# Patient Record
Sex: Male | Born: 2000
Health system: Southern US, Community
[De-identification: ages and names within clinical notes are randomized; demographics above are authoritative.]

## PROBLEM LIST (undated history)

## (undated) DIAGNOSIS — E119 Type 2 diabetes mellitus without complications: Secondary | ICD-10-CM

## (undated) DIAGNOSIS — J45909 Unspecified asthma, uncomplicated: Secondary | ICD-10-CM

## (undated) HISTORY — DX: Unspecified asthma, uncomplicated: J45.909

## (undated) HISTORY — DX: Type 2 diabetes mellitus without complications: E11.9

---
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
## 2022-01-30 ENCOUNTER — Other Ambulatory Visit (INDEPENDENT_AMBULATORY_CARE_PROVIDER_SITE_OTHER): Payer: Self-pay | Admitting: Family

## 2022-01-30 DIAGNOSIS — E1065 Type 1 diabetes mellitus with hyperglycemia: Secondary | ICD-10-CM

## 2022-03-28 ENCOUNTER — Ambulatory Visit (INDEPENDENT_AMBULATORY_CARE_PROVIDER_SITE_OTHER): Payer: No Typology Code available for payment source | Admitting: Family

## 2022-05-01 ENCOUNTER — Ambulatory Visit (INDEPENDENT_AMBULATORY_CARE_PROVIDER_SITE_OTHER): Payer: No Typology Code available for payment source | Admitting: Family

## 2022-05-01 ENCOUNTER — Encounter (INDEPENDENT_AMBULATORY_CARE_PROVIDER_SITE_OTHER): Payer: Self-pay | Admitting: Family

## 2022-05-01 VITALS — BP 118/70 | HR 76 | Wt 191.2 lb

## 2022-05-01 DIAGNOSIS — E1065 Type 1 diabetes mellitus with hyperglycemia: Secondary | ICD-10-CM | POA: Diagnosis not present

## 2022-05-01 DIAGNOSIS — Z4681 Encounter for fitting and adjustment of insulin pump: Secondary | ICD-10-CM | POA: Diagnosis not present

## 2022-05-01 DIAGNOSIS — E109 Type 1 diabetes mellitus without complications: Secondary | ICD-10-CM

## 2022-05-01 LAB — POCT GLUCOSE (DEVICE FOR HOME USE): POC Glucose: 121 mg/dl — AB (ref 70–99)

## 2022-05-01 LAB — POCT GLYCOSYLATED HEMOGLOBIN (HGB A1C): Hemoglobin A1C: 8.6 % — AB (ref 4.0–5.6)

## 2022-05-01 NOTE — Progress Notes (Signed)
Pediatric Endocrinology Diabetes Consultation Follow-up Visit  Stephen Weber [birth year] [id number]  Chief Complaint: Follow-up type 1 diabetes   [name], NP   HPI: Stephen Weber  is a 22 y.o. male presenting for follow-up of type 1 diabetes. he is accompanied to this visit by his father and sister.   1. "Stephen Weber" was seen by his PCP on November 13th 2015 for a sick visit due to family concerns regarding weight loss, increased thirst and frequent urination. He had lost ~15 pounds.  At the PCP's office Stephen Weber was noted to have a BG in the 400s and he was admitted to the pediatric unit at [facility] for further evaluation and management. CBG was 300. Venous pH was 7.342. Serum glucose was 338, CO2 20, and anion gap 18. Urine ketones were 15. HbA1c was 15.8%. Anti-GAD antibody was positive at 5.6 (normal < 1). Anti-insulin antibody was positive at 2.3 (normal < 0.4). Anti-islet cell antibody was negative. TSH was 1.460. Free T4 was 0.98. He was started on an MDI insulin plan with Lantus and Novolog.  He transitioned to an omnipod and dexcom CGM in 05/2015.  2. Since last visit to [facility] on 12/2021, Stephen Weber has been well.  No ER visits or hospitalizations.    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   States that diabetes care has been "pretty good, Not many changes". He is trying to do a cutting diet so he has cut back on carbs. Omnipod 5 and Dexcom CGM are working well. Reports bolusing consistently, usually boluses when he starts eating. Estimates his carb intake at meals is 40-50 grams of carbs per meal. Hypoglycemia is rare, none severe or requiring glucagon.    Insulin regimen: Using Humalog in his pod  12AM-8AM 1.0   8AM-2PM 1.3  2PM-12AM 1.1            Insulin to Carbohydrate Ratio 12AM-6AM 15  6AM-6pm 7  6pm-10pm 8  10pm-12pm 9       Insulin Sensitivity Factor 12AM 40               Target Blood Glucose 12AM-6AM 110  6AM-9PM 120   9PM-12AM 110        Active insulin time 3 hours  Hypoglycemia:  Able to feel lows. He usually feels shaky.  No glucagon needed recently.   Insulin pump and CGM download:    - Pump report shows he is entering 10-35 grams of carbs per meal. Usually having prolonged periods of hyperglycemia after meals.  Med-alert ID: not wearing  Pump/CGM sites: Using abdomen and legs  for pump and legs, and abs for CGM.  Annual labs due: Last exam on 02/2022. Due on 02/2023    3. ROS: Greater than 10 systems reviewed with pertinent positives listed in HPI, otherwise neg. Review of Systems  Constitutional:  Negative for malaise/fatigue.  HENT: Negative.    Eyes:  Negative for blurred vision and pain.  Respiratory:  Negative for cough and shortness of breath.   Cardiovascular:  Negative for chest pain and palpitations.  Gastrointestinal:  Negative for abdominal pain, constipation, diarrhea, nausea and vomiting.  Genitourinary:  Negative for frequency and urgency.  Musculoskeletal:  Negative for neck pain.  Skin:  Negative for itching and rash.  Neurological:  Negative for dizziness, tingling, tremors, sensory change, seizures, weakness and headaches.  Endo/Heme/Allergies:  Negative for polydipsia.  Psychiatric/Behavioral:  Negative for depression. The patient is not nervous/anxious.  Past Medical History:   Past Medical History:  Diagnosis Date   Asthma    Diabetes mellitus without complication ([place])     Medications:  Outpatient Encounter Medications as of 05/01/2022  Medication Sig   Continuous Blood Gluc Sensor (DEXCOM G6 SENSOR) MISC CHANGE SENSOR EVERY 10 DAYS   Continuous Blood Gluc Transmit (DEXCOM G6 TRANSMITTER) MISC USE WITH DEXCOM G6 SENSOR  REUSE FOR 3 MONTHS   insulin aspart (NOVOLOG) 100 UNIT/ML injection INJECT UP TO 200 UNITS IN INSULIN PUMP EVERY 48-72 HOURS PER DKA AND HYPERGLYCEMIA PROTOCOLS (90 DAY SUPPLY)   Insulin Disposable Pump (OMNIPOD 5 G6 POD, GEN 5,) MISC  Inject 1 Device into the skin as directed. Change pod every 2 days. Patient will need 3 boxes (each contain 5 pods) for a 30 day supply. Please fill for [facility] [address]21.   ACCU-CHEK FASTCLIX LANCETS MISC 1 each by Does not apply route as directed. Check sugar 6 x daily (Patient not taking: Reported on 04/05/2021)   acetone, urine, test strip Check ketones per protocol (Patient not taking: Reported on 04/05/2021)   albuterol (PROVENTIL, VENTOLIN) (5 MG/ML) 0.5% NEBU Take by nebulization daily as needed. (Patient not taking: Reported on 12/20/2019)   Blood Glucose Monitoring Suppl (ACCU-CHEK GUIDE) w/Device KIT 1 kit by Does not apply route daily as needed. (Patient not taking: Reported on 04/05/2021)   glucose blood test strip Use to check blood sugars 6 times daily (Patient not taking: Reported on 04/05/2021)   insulin aspart (NOVOLOG) 100 UNIT/ML FlexPen Use up to 50 units daily per provider instructions; to use as back up in case insulin pump fails (Patient not taking: Reported on 05/01/2022)   insulin degludec (TRESIBA FLEXTOUCH) 100 UNIT/ML FlexTouch Pen Inject up to 50 units per day SubQ if pump failure (Patient not taking: Reported on 05/01/2022)   Insulin Pen Needle 31G X 5 MM MISC Use to administer insulin (Patient not taking: Reported on 12/27/2021)   montelukast (SINGULAIR) 10 MG tablet Take 10 mg by mouth at bedtime. (Patient not taking: Reported on 02/19/2021)   No facility-administered encounter medications on file as of 05/01/2022.    Allergies: Allergies  Allergen Reactions   Pollen Extract     Reports skin lumps when using novolog  Surgical History: No recent surgeries  Family History:  Family History  Problem Relation Age of Onset   Diabetes Maternal Grandmother    Kidney disease Maternal Grandfather    Hypertension Maternal Grandfather    Hypertension Paternal Grandmother    Healthy Mother    Healthy Father      Social History: Lives with: parents and sister Working at  [employer]  Physical Exam:  Vitals:   05/01/22 0905  BP: 118/70  Pulse: 76  Weight: 191 lb 3.2 oz (86.7 kg)   BP 118/70   Pulse 76   Wt 191 lb 3.2 oz (86.7 kg)   BMI 28.24 kg/m  Body mass index: body mass index is 28.24 kg/m. Blood pressure %iles are not available for patients who are 18 years or older.  Ht Readings from Last 3 Encounters:  09/12/21 _0  (1.753 m) (42 %, Z= -0.19)*  04/05/21 5' 9.96" (1.777 m) (57 %, Z= 0.17)*  04/05/21 5' 9.96" (1.777 m) (57 %, Z= 0.17)*   * Growth percentiles are based on CDC (Boys, 2-20 Years) data.   Wt Readings from Last 3 Encounters:  05/01/22 191 lb 3.2 oz (86.7 kg) (88 %, Z= 1.20)*  12/27/21 188 lb 12.8  oz (85.6 kg) (88 %, Z= 1.17)*  09/12/21 175 lb 6.4 oz (79.6 kg) (79 %, Z= 0.81)*   * Growth percentiles are based on CDC (Boys, 2-20 Years) data.   Physical Exam   General: Well developed, well nourished male in no acute distress.  Head: Normocephalic, atraumatic.   Eyes:  Pupils equal and round. EOMI.  Sclera white.  No eye drainage.   Ears/Nose/Mouth/Throat: Nares patent, no nasal drainage.  Normal dentition, mucous membranes moist.  Neck: supple, no cervical lymphadenopathy, no thyromegaly Cardiovascular: regular rate, normal S1/S2, no murmurs Respiratory: No increased work of breathing.  Lungs clear to auscultation bilaterally.  No wheezes. Abdomen: soft, nontender, nondistended. Normal bowel sounds.  No appreciable masses  Extremities: warm, well perfused, cap refill < 2 sec.   Musculoskeletal: Normal muscle mass.  Normal strength Skin: warm, dry.  No rash or lesions. Neurologic: alert and oriented, normal speech, no tremor   Labs:  Last a1c: 7.8 on 12/2021  Lab Results  Component Value Date   HGBA1C 8.6 (A) 05/01/2022   Results for orders placed or performed in visit on 05/01/22  POCT glycosylated hemoglobin (Hb A1C)  Result Value Ref Range   Hemoglobin A1C 8.6 (A) 4.0 - 5.6 %   HbA1c POC (<>  result, manual entry)     HbA1c, POC (prediabetic range)     HbA1c, POC (controlled diabetic range)    POCT Glucose (Device for Home Use)  Result Value Ref Range   Glucose Fasting, POC     POC Glucose 121 (A) 70 - 99 mg/dl    Assessment/Plan: Bryten is a 22 y.o. male with uncontrolled type 1 diabetes on Omnipod 5  insulin pump. Having pattern of post prandial hyperglycemia which is likely due to underestimating carbs (we reviewed his meals and he is normally eating 35 grams at breakfast vs the 10-15 he is estimating). Hemoglobin A1c is 8.6% which is higher then ADA goal of <7%.   1. DM w/o complication type I, uncontrolled (HCC)Hyperglycmia/ - Reviewed insulin pump and CGM download. Discussed trends and patterns.  - Rotate pump sites to prevent scar tissue.  - bolus 15 minutes prior to eating to limit blood sugar spikes.  - Discussed signs and symptoms of hypoglycemia. Always have glucose available.  - POCT glucose and hemoglobin A1c  - Reviewed growth chart.  - Discussed importance of accurate carb counting. We reviewed multiple meals to help him have a baseline for estimating carb intake.   2. Insulin pump titration/Insulin pump in place  12AM-8AM 1.0   8AM-2PM 1.3--> 1.35  2PM-12AM 1.1--> 1.20           28.1 units per day    Tresiba  28 units  - Humalog: 1 unit per 8 grams of carbs   - ISF: 1 unit for ever 40 over 120 Follow-up:   3 month    LOS: >40  spent today reviewing the medical chart, counseling the patient/family, and documenting today's visit.   When a patient is on insulin, intensive monitoring of blood glucose levels is necessary to avoid hyperglycemia and hypoglycemia. Severe hyperglycemia/hypoglycemia can lead to hospital admissions and be life threatening.    Hermenia Bers,  FNP-C  Pediatric Specialist  [address] [place]  [place], [address]  Tele: [phone]

## 2022-05-01 NOTE — Patient Instructions (Signed)
12AM-8AM 1.0   8AM-2PM 1.3--> 1.35  2PM-12AM 1.1--> 1.20           28.1 units per day   It was a pleasure seeing you in clinic today. Please do not hesitate to contact me if you have questions or concerns.   Please sign up for MyChart. This is a communication tool that allows you to send an email directly to me. This can be used for questions, prescriptions and blood sugar reports. We will also release labs to you with instructions on MyChart. Please do not use MyChart if you need immediate or emergency assistance. Ask our wonderful front office staff if you need assistance.

## 2022-05-11 ENCOUNTER — Other Ambulatory Visit (INDEPENDENT_AMBULATORY_CARE_PROVIDER_SITE_OTHER): Payer: Self-pay | Admitting: Family

## 2022-05-11 DIAGNOSIS — E1065 Type 1 diabetes mellitus with hyperglycemia: Secondary | ICD-10-CM

## 2022-07-19 ENCOUNTER — Other Ambulatory Visit (INDEPENDENT_AMBULATORY_CARE_PROVIDER_SITE_OTHER): Payer: Self-pay | Admitting: Family

## 2022-07-19 DIAGNOSIS — E109 Type 1 diabetes mellitus without complications: Secondary | ICD-10-CM

## 2022-07-31 ENCOUNTER — Ambulatory Visit (INDEPENDENT_AMBULATORY_CARE_PROVIDER_SITE_OTHER): Payer: No Typology Code available for payment source | Admitting: Family

## 2022-09-05 ENCOUNTER — Ambulatory Visit (INDEPENDENT_AMBULATORY_CARE_PROVIDER_SITE_OTHER): Payer: No Typology Code available for payment source | Admitting: Family

## 2022-09-05 NOTE — Progress Notes (Deleted)
Pediatric Endocrinology Diabetes Consultation Follow-up Visit  Stephen Weber 2000-06-18 [id number]  Chief Complaint: Follow-up type 1 diabetes   Carlean Jews, NP   HPI: Stephen Weber  is a 22 y.o. male presenting for follow-up of type 1 diabetes. he is accompanied to this visit by his father and sister.   1. "Stephen Weber" was seen by his PCP on November 13th 2015 for a sick visit due to family concerns regarding weight loss, increased thirst and frequent urination. He had lost ~15 pounds.  At the PCP's office Stephen Weber was noted to have a BG in the 400s and he was admitted to the pediatric unit at [facility] for further evaluation and management. CBG was 300. Venous pH was 7.342. Serum glucose was 338, CO2 20, and anion gap 18. Urine ketones were 15. HbA1c was 15.8%. Anti-GAD antibody was positive at 5.6 (normal < 1). Anti-insulin antibody was positive at 2.3 (normal < 0.4). Anti-islet cell antibody was negative. TSH was 1.460. Free T4 was 0.98. He was started on an MDI insulin plan with Lantus and Novolog.  He transitioned to an omnipod and dexcom CGM in 05/2015.  2. Since last visit to [facility] on 04/2022, Stephen Weber has been well.  No ER visits or hospitalizations.    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   States that diabetes care has been "pretty good, Not many changes". He is trying to do a cutting diet so he has cut back on carbs. Omnipod 5 and Dexcom CGM are working well. Reports bolusing consistently, usually boluses when he starts eating. Estimates his carb intake at meals is 40-50 grams of carbs per meal. Hypoglycemia is rare, none severe or requiring glucagon.    Insulin regimen: Using Humalog in his pod 12AM-8AM 1.0   8AM-2PM 1.35  2PM-12AM 1.20           28.1 units per day  Insulin to Carbohydrate Ratio 12AM-6AM 15  6AM-6pm 7  6pm-10pm 8  10pm-12pm 9       Insulin Sensitivity Factor 12AM 40               Target Blood Glucose 12AM-6AM 110   6AM-9PM 120  9PM-12AM 110        Active insulin time 3 hours  Hypoglycemia:  Able to feel lows. He usually feels shaky.  No glucagon needed recently.   Insulin pump and CGM download:    - Pump report shows he is entering 10-35 grams of carbs per meal. Usually having prolonged periods of hyperglycemia after meals.  Med-alert ID: not wearing  Pump/CGM sites: Using abdomen and legs  for pump and legs, and abs for CGM.  Annual labs due: Last exam on 02/2022. Due on 02/2023    3. ROS: Greater than 10 systems reviewed with pertinent positives listed in HPI, otherwise neg. Review of Systems  Constitutional:  Negative for malaise/fatigue.  HENT: Negative.    Eyes:  Negative for blurred vision and pain.  Respiratory:  Negative for cough and shortness of breath.   Cardiovascular:  Negative for chest pain and palpitations.  Gastrointestinal:  Negative for abdominal pain, constipation, diarrhea, nausea and vomiting.  Genitourinary:  Negative for frequency and urgency.  Musculoskeletal:  Negative for neck pain.  Skin:  Negative for itching and rash.  Neurological:  Negative for dizziness, tingling, tremors, sensory change, seizures, weakness and headaches.  Endo/Heme/Allergies:  Negative for polydipsia.  Psychiatric/Behavioral:  Negative for depression. The patient is not nervous/anxious.  Past Medical History:   Past Medical History:  Diagnosis Date   Asthma    Diabetes mellitus without complication (HCC)     Medications:  Outpatient Encounter Medications as of 09/05/2022  Medication Sig   ACCU-CHEK FASTCLIX LANCETS MISC 1 each by Does not apply route as directed. Check sugar 6 x daily (Patient not taking: Reported on 04/05/2021)   acetone, urine, test strip Check ketones per protocol (Patient not taking: Reported on 04/05/2021)   albuterol (PROVENTIL, VENTOLIN) (5 MG/ML) 0.5% NEBU Take by nebulization daily as needed. (Patient not taking: Reported on 12/20/2019)   Blood Glucose  Monitoring Suppl (ACCU-CHEK GUIDE) w/Device KIT 1 kit by Does not apply route daily as needed. (Patient not taking: Reported on 04/05/2021)   Continuous Blood Gluc Sensor (DEXCOM G6 SENSOR) MISC CHANGE SENSOR EVERY 10 DAYS   Continuous Blood Gluc Transmit (DEXCOM G6 TRANSMITTER) MISC USE WITH DEXCOM G6 SENSOR  REUSE FOR 3 MONTHS   glucose blood test strip Use to check blood sugars 6 times daily (Patient not taking: Reported on 04/05/2021)   insulin aspart (NOVOLOG) 100 UNIT/ML FlexPen Use up to 50 units daily per provider instructions; to use as back up in case insulin pump fails (Patient not taking: Reported on 05/01/2022)   insulin aspart (NOVOLOG) 100 UNIT/ML injection INJECT UP TO 200 UNITS IN INSULIN PUMP EVERY 48-72 HOURS PER DKA AND HYPERGLYCEMIA PROTOCOLS (90 DAY SUPPLY)   insulin degludec (TRESIBA FLEXTOUCH) 100 UNIT/ML FlexTouch Pen Inject up to 50 units per day SubQ if pump failure (Patient not taking: Reported on 05/01/2022)   Insulin Disposable Pump (OMNIPOD 5 G6 PODS, GEN 5,) MISC INJECT 1 DEVICE            SUBCUTANEOUSLY AS DIRECTED.CHANGE POD EVERY 2 DAYS.   Insulin Pen Needle 31G X 5 MM MISC Use to administer insulin (Patient not taking: Reported on 12/27/2021)   montelukast (SINGULAIR) 10 MG tablet Take 10 mg by mouth at bedtime. (Patient not taking: Reported on 02/19/2021)   No facility-administered encounter medications on file as of 09/05/2022.    Allergies: Allergies  Allergen Reactions   Pollen Extract     Reports skin lumps when using novolog  Surgical History: No recent surgeries  Family History:  Family History  Problem Relation Age of Onset   Diabetes Maternal Grandmother    Kidney disease Maternal Grandfather    Hypertension Maternal Grandfather    Hypertension Paternal Grandmother    Healthy Mother    Healthy Father      Social History: Lives with: parents and sister Working at [employer]  Physical Exam:  There were no vitals filed for this  visit.  There were no vitals taken for this visit. Body mass index: body mass index is unknown because there is no height or weight on file. Growth %ile SmartLinks can only be used for patients less than 48 years old.  Ht Readings from Last 3 Encounters:  09/12/21 5\' 9"  (1.753 m) (42 %, Z= -0.19)*  04/05/21 5' 9.96" (1.777 m) (57 %, Z= 0.17)*  04/05/21 5' 9.96" (1.777 m) (57 %, Z= 0.17)*   * Growth percentiles are based on CDC (Boys, 2-20 Years) data.   Wt Readings from Last 3 Encounters:  05/01/22 191 lb 3.2 oz (86.7 kg) (88 %, Z= 1.20)*  12/27/21 188 lb 12.8 oz (85.6 kg) (88 %, Z= 1.17)*  09/12/21 175 lb 6.4 oz (79.6 kg) (79 %, Z= 0.81)*   * Growth percentiles are based on CDC (  Boys, 2-20 Years) data.   Physical Exam   General: Well developed, well nourished male in no acute distress.   Head: Normocephalic, atraumatic.   Eyes:  Pupils equal and round. EOMI.  Sclera white.  No eye drainage.   Ears/Nose/Mouth/Throat: Nares patent, no nasal drainage.  Normal dentition, mucous membranes moist.  Neck: supple, no cervical lymphadenopathy, no thyromegaly Cardiovascular: regular rate, normal S1/S2, no murmurs Respiratory: No increased work of breathing.  Lungs clear to auscultation bilaterally.  No wheezes. Abdomen: soft, nontender, nondistended. Normal bowel sounds.  No appreciable masses  Extremities: warm, well perfused, cap refill < 2 sec.   Musculoskeletal: Normal muscle mass.  Normal strength Skin: warm, dry.  No rash or lesions. Neurologic: alert and oriented, normal speech, no tremor   Labs:  Last a1c: 8.6on 04/2022  Lab Results  Component Value Date   HGBA1C 8.6 (A) 05/01/2022   Results for orders placed or performed in visit on 05/01/22  POCT glycosylated hemoglobin (Hb A1C)  Result Value Ref Range   Hemoglobin A1C 8.6 (A) 4.0 - 5.6 %   HbA1c POC (<> result, manual entry)     HbA1c, POC (prediabetic range)     HbA1c, POC (controlled diabetic range)    POCT  Glucose (Device for Home Use)  Result Value Ref Range   Glucose Fasting, POC     POC Glucose 121 (A) 70 - 99 mg/dl    Assessment/Plan: Stephen Weber is a 22 y.o. male with uncontrolled type 1 diabetes on Omnipod 5  insulin pump. Having pattern of post prandial hyperglycemia which is likely due to underestimating carbs (we reviewed his meals and he is normally eating 35 grams at breakfast vs the 10-15 he is estimating). Hemoglobin A1c is 8.6% which is higher then ADA goal of <7%.   1. DM w/o complication type I, uncontrolled (HCC)Hyperglycmia/ - Reviewed insulin pump and CGM download. Discussed trends and patterns.  - Rotate pump sites to prevent scar tissue.  - bolus 15 minutes prior to eating to limit blood sugar spikes.  - Reviewed carb counting and importance of accurate carb counting.  - Discussed signs and symptoms of hypoglycemia. Always have glucose available.  - POCT glucose and hemoglobin A1c  - Reviewed growth chart.    2. Insulin pump titration/Insulin pump in place  12AM-8AM 1.0   8AM-2PM 1.3--> 1.35  2PM-12AM 1.1--> 1.20           28.1 units per day    Tresiba  28 units  - Humalog: 1 unit per 8 grams of carbs   - ISF: 1 unit for ever 40 over 120 Follow-up:   3 month    LOS: >40  spent today reviewing the medical chart, counseling the patient/family, and documenting today's visit.   When a patient is on insulin, intensive monitoring of blood glucose levels is necessary to avoid hyperglycemia and hypoglycemia. Severe hyperglycemia/hypoglycemia can lead to hospital admissions and be life threatening.    Gretchen Short,  FNP-C  Pediatric Specialist  [address]  [place], [address]  Tele: [phone]

## 2022-10-01 ENCOUNTER — Telehealth: Payer: Self-pay

## 2022-10-01 NOTE — Telephone Encounter (Signed)
LVM for patient to call back [phone], or to call PCP office to schedule follow up apt. AS, CMA  

## 2022-10-05 ENCOUNTER — Other Ambulatory Visit (INDEPENDENT_AMBULATORY_CARE_PROVIDER_SITE_OTHER): Payer: Self-pay | Admitting: Family

## 2022-10-05 DIAGNOSIS — E1065 Type 1 diabetes mellitus with hyperglycemia: Secondary | ICD-10-CM

## 2022-11-13 ENCOUNTER — Encounter (INDEPENDENT_AMBULATORY_CARE_PROVIDER_SITE_OTHER): Payer: Self-pay

## 2022-12-28 ENCOUNTER — Other Ambulatory Visit (INDEPENDENT_AMBULATORY_CARE_PROVIDER_SITE_OTHER): Payer: Self-pay | Admitting: Family

## 2022-12-28 DIAGNOSIS — E109 Type 1 diabetes mellitus without complications: Secondary | ICD-10-CM

## 2023-03-07 ENCOUNTER — Other Ambulatory Visit (INDEPENDENT_AMBULATORY_CARE_PROVIDER_SITE_OTHER): Payer: Self-pay | Admitting: Family

## 2023-03-07 DIAGNOSIS — E109 Type 1 diabetes mellitus without complications: Secondary | ICD-10-CM

## 2023-04-04 ENCOUNTER — Other Ambulatory Visit (INDEPENDENT_AMBULATORY_CARE_PROVIDER_SITE_OTHER): Payer: Self-pay | Admitting: Family

## 2023-04-04 DIAGNOSIS — E109 Type 1 diabetes mellitus without complications: Secondary | ICD-10-CM

## 2023-04-23 ENCOUNTER — Telehealth (INDEPENDENT_AMBULATORY_CARE_PROVIDER_SITE_OTHER): Payer: Self-pay | Admitting: Family

## 2023-04-23 DIAGNOSIS — E109 Type 1 diabetes mellitus without complications: Secondary | ICD-10-CM

## 2023-04-23 DIAGNOSIS — E1065 Type 1 diabetes mellitus with hyperglycemia: Secondary | ICD-10-CM

## 2023-04-23 MED ORDER — INSULIN ASPART 100 UNIT/ML IJ SOLN: INTRAMUSCULAR | 0 refills | Status: DC

## 2023-04-23 NOTE — Telephone Encounter (Signed)
Re-routing so relevant parties can see the note

## 2023-04-23 NOTE — Telephone Encounter (Signed)
Returned call to [place]; he is in [facility] and he dropped his insulin vial and it shattered.  Has enough insulin in his pump to last overnight.  I sent the following prescription:   Encouraged to call pharmacy to see when he can pick it up.  Casimiro Needle, MD

## 2023-04-23 NOTE — Telephone Encounter (Signed)
  Name of who is calling: Juliette Alcide  Caller's Relationship to Patient: Self  Best contact number: [phone]  Provider they see: Ovidio Kin   Reason for call: Patient called to inform the practice he had a little more than one days worth of insulin left and was requesting a refill.      PRESCRIPTION REFILL ONLY  Name of prescription: Novalog  Pharmacy: [facility] [id number] [facility]

## 2023-04-26 ENCOUNTER — Other Ambulatory Visit (INDEPENDENT_AMBULATORY_CARE_PROVIDER_SITE_OTHER): Payer: Self-pay | Admitting: Family

## 2023-04-26 DIAGNOSIS — E1065 Type 1 diabetes mellitus with hyperglycemia: Secondary | ICD-10-CM

## 2023-05-27 ENCOUNTER — Telehealth (INDEPENDENT_AMBULATORY_CARE_PROVIDER_SITE_OTHER): Payer: Self-pay

## 2023-05-27 ENCOUNTER — Ambulatory Visit (INDEPENDENT_AMBULATORY_CARE_PROVIDER_SITE_OTHER): Payer: No Typology Code available for payment source | Admitting: Family

## 2023-05-27 NOTE — Progress Notes (Deleted)
 Pediatric Endocrinology Diabetes Consultation Follow-up Visit  Stephen Weber 12-Apr-2001 [id number]  Chief Complaint: Follow-up type 1 diabetes   Carlean Jews, NP   HPI: Stephen Weber  is a 23 y.o. male presenting for follow-up of type 1 diabetes. he is accompanied to this visit by his father and sister.   1. "Stephen Weber" was seen by his PCP on November 13th 2015 for a sick visit due to family concerns regarding weight loss, increased thirst and frequent urination. He had lost ~15 pounds.  At the PCP's office Stephen Weber was noted to have a BG in the 400s and he was admitted to the pediatric unit at [facility] for further evaluation and management. CBG was 300. Venous pH was 7.342. Serum glucose was 338, CO2 20, and anion gap 18. Urine ketones were 15. HbA1c was 15.8%. Anti-GAD antibody was positive at 5.6 (normal < 1). Anti-insulin antibody was positive at 2.3 (normal < 0.4). Anti-islet cell antibody was negative. TSH was 1.460. Free T4 was 0.98. He was started on an MDI insulin plan with Lantus and Novolog.  He transitioned to an omnipod and dexcom CGM in 05/2015.  2. Since last visit to [facility] on 04/2022, Stephen Weber has been well.  No ER visits or hospitalizations.    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   States that diabetes care has been "pretty good, Not many changes". He is trying to do a cutting diet so he has cut back on carbs. Omnipod 5 and Dexcom CGM are working well. Reports bolusing consistently, usually boluses when he starts eating. Estimates his carb intake at meals is 40-50 grams of carbs per meal. Hypoglycemia is rare, none severe or requiring glucagon.    Insulin regimen: Using Humalog in his pod  12AM-8AM 1.0   8AM-2PM 1.35  2PM-12AM 1.20            Insulin to Carbohydrate Ratio 12AM-6AM 15  6AM-6pm 7  6pm-10pm 8  10pm-12pm 9       Insulin Sensitivity Factor 12AM 40               Target Blood Glucose 12AM-6AM 110  6AM-9PM 120   9PM-12AM 110        Active insulin time 3 hours  Hypoglycemia:  Able to feel lows. He usually feels shaky.  No glucagon needed recently.   Insulin pump and CGM download:    - Pump report shows he is entering 10-35 grams of carbs per meal. Usually having prolonged periods of hyperglycemia after meals.  Med-alert ID: not wearing  Pump/CGM sites: Using abdomen and legs  for pump and legs, and abs for CGM.  Annual labs due: Last exam on 02/2022. Due on 02/2023    3. ROS: Greater than 10 systems reviewed with pertinent positives listed in HPI, otherwise neg. Review of Systems  Constitutional:  Negative for malaise/fatigue.  HENT: Negative.    Eyes:  Negative for blurred vision and pain.  Respiratory:  Negative for cough and shortness of breath.   Cardiovascular:  Negative for chest pain and palpitations.  Gastrointestinal:  Negative for abdominal pain, constipation, diarrhea, nausea and vomiting.  Genitourinary:  Negative for frequency and urgency.  Musculoskeletal:  Negative for neck pain.  Skin:  Negative for itching and rash.  Neurological:  Negative for dizziness, tingling, tremors, sensory change, seizures, weakness and headaches.  Endo/Heme/Allergies:  Negative for polydipsia.  Psychiatric/Behavioral:  Negative for depression. The patient is not nervous/anxious.  Past Medical History:   Past Medical History:  Diagnosis Date   Asthma    Diabetes mellitus without complication (HCC)     Medications:  Outpatient Encounter Medications as of 05/27/2023  Medication Sig   ACCU-CHEK FASTCLIX LANCETS MISC 1 each by Does not apply route as directed. Check sugar 6 x daily (Patient not taking: Reported on 04/05/2021)   acetone, urine, test strip Check ketones per protocol (Patient not taking: Reported on 04/05/2021)   albuterol (PROVENTIL, VENTOLIN) (5 MG/ML) 0.5% NEBU Take by nebulization daily as needed. (Patient not taking: Reported on 12/20/2019)   Blood Glucose Monitoring  Suppl (ACCU-CHEK GUIDE) w/Device KIT 1 kit by Does not apply route daily as needed. (Patient not taking: Reported on 04/05/2021)   Continuous Glucose Sensor (DEXCOM G6 SENSOR) MISC CHANGE SENSOR EVERY 10 DAYS   Continuous Glucose Transmitter (DEXCOM G6 TRANSMITTER) MISC USE WITH DEXCOM G6 SENSOR  REUSE FOR 3 MONTHS   glucose blood test strip Use to check blood sugars 6 times daily (Patient not taking: Reported on 04/05/2021)   insulin aspart (NOVOLOG) 100 UNIT/ML FlexPen Use up to 50 units daily per provider instructions; to use as back up in case insulin pump fails (Patient not taking: Reported on 05/01/2022)   insulin aspart (NOVOLOG) 100 UNIT/ML injection INJECT UP TO 200 UNITS IN INSULIN PUMP EVERY 48-72 HOURS PER DKA AND HYPERGLYCEMIA PROTOCOLS (90 DAY SUPPLY)   insulin aspart (NOVOLOG) 100 UNIT/ML injection Inject 200 units into insulin pump every 2 days   insulin degludec (TRESIBA FLEXTOUCH) 100 UNIT/ML FlexTouch Pen Inject up to 50 units per day SubQ if pump failure (Patient not taking: Reported on 05/01/2022)   Insulin Disposable Pump (OMNIPOD 5 DEXG7G6 PODS GEN 5) MISC INJECT 1 DEVICE            SUBCUTANEOUSLY AS DIRECTED.CHANGE POD EVERY 2 DAYS.   Insulin Pen Needle 31G X 5 MM MISC Use to administer insulin (Patient not taking: Reported on 12/27/2021)   montelukast (SINGULAIR) 10 MG tablet Take 10 mg by mouth at bedtime. (Patient not taking: Reported on 02/19/2021)   No facility-administered encounter medications on file as of 05/27/2023.    Allergies: Allergies  Allergen Reactions   Pollen Extract     Reports skin lumps when using novolog  Surgical History: No recent surgeries  Family History:  Family History  Problem Relation Age of Onset   Diabetes Maternal Grandmother    Kidney disease Maternal Grandfather    Hypertension Maternal Grandfather    Hypertension Paternal Grandmother    Healthy Mother    Healthy Father      Social History: Lives with: parents and sister Working  at [employer]  Physical Exam:  There were no vitals filed for this visit.  There were no vitals taken for this visit. Body mass index: body mass index is unknown because there is no height or weight on file. Growth %ile SmartLinks can only be used for patients less than 13 years old.  Ht Readings from Last 3 Encounters:  09/12/21 5\' 9"  (1.753 m) (42%, Z= -0.19)*  04/05/21 5' 9.96" (1.777 m) (57%, Z= 0.17)*  04/05/21 5' 9.96" (1.777 m) (57%, Z= 0.17)*   * Growth percentiles are based on CDC (Boys, 2-20 Years) data.   Wt Readings from Last 3 Encounters:  05/01/22 191 lb 3.2 oz (86.7 kg) (88%, Z= 1.20)*  12/27/21 188 lb 12.8 oz (85.6 kg) (88%, Z= 1.17)*  09/12/21 175 lb 6.4 oz (79.6 kg) (79%, Z= 0.81)*   *  Growth percentiles are based on CDC (Boys, 2-20 Years) data.   Physical Exam   General: Well developed, well nourished male in no acute distress. Head: Normocephalic, atraumatic.   Eyes:  Pupils equal and round. EOMI.  Sclera white.  No eye drainage.   Ears/Nose/Mouth/Throat: Nares patent, no nasal drainage.  Normal dentition, mucous membranes moist.  Neck: supple, no cervical lymphadenopathy, no thyromegaly Cardiovascular: regular rate, normal S1/S2, no murmurs Respiratory: No increased work of breathing.  Lungs clear to auscultation bilaterally.  No wheezes. Abdomen: soft, nontender, nondistended. Normal bowel sounds.  No appreciable masses  Extremities: warm, well perfused, cap refill < 2 sec.   Musculoskeletal: Normal muscle mass.  Normal strength Skin: warm, dry.  No rash or lesions. Neurologic: alert and oriented, normal speech, no tremor   Labs:  Last a1c: 7.8 on 12/2021  Lab Results  Component Value Date   HGBA1C 8.6 (A) 05/01/2022   Results for orders placed or performed in visit on 05/01/22  POCT Glucose (Device for Home Use)   Collection Time: 05/01/22  9:11 AM  Result Value Ref Range   Glucose Fasting, POC     POC Glucose 121 (A) 70 - 99 mg/dl   POCT glycosylated hemoglobin (Hb A1C)   Collection Time: 05/01/22  9:14 AM  Result Value Ref Range   Hemoglobin A1C 8.6 (A) 4.0 - 5.6 %   HbA1c POC (<> result, manual entry)     HbA1c, POC (prediabetic range)     HbA1c, POC (controlled diabetic range)      Assessment/Plan: Tyrel is a 23 y.o. male with uncontrolled type 1 diabetes on Omnipod 5  insulin pump. Having pattern of post prandial hyperglycemia which is likely due to underestimating carbs (we reviewed his meals and he is normally eating 35 grams at breakfast vs the 10-15 he is estimating). Hemoglobin A1c is 8.6% which is higher then ADA goal of <7%.   1. DM w/o complication type I, uncontrolled (HCC)Hyperglycmia/ - Reviewed insulin pump and CGM download. Discussed trends and patterns.  - Rotate pump sites to prevent scar tissue.  - bolus 15 minutes prior to eating to limit blood sugar spikes.  - Reviewed carb counting and importance of accurate carb counting.  - Discussed signs and symptoms of hypoglycemia. Always have glucose available.  - POCT glucose and hemoglobin A1c  - Reviewed growth chart.    2. Insulin pump titration/Insulin pump in place     Tresiba  28 units  - Humalog: 1 unit per 8 grams of carbs   - ISF: 1 unit for ever 40 over 120 Follow-up:   3 month    LOS: >40  spent today reviewing the medical chart, counseling the patient/family, and documenting today's visit.   When a patient is on insulin, intensive monitoring of blood glucose levels is necessary to avoid hyperglycemia and hypoglycemia. Severe hyperglycemia/hypoglycemia can lead to hospital admissions and be life threatening.    Gretchen Short,  FNP-C  Pediatric Specialist  [address]  [place], [address]  Tele: [phone]

## 2023-05-27 NOTE — Telephone Encounter (Signed)
Called to ask about if he wanted to be referred out to an adult endo Stephen Weber said he wants to be referred to an adult endo. I also told grant per spenser that any refills cannot be sent in due to him not being seen since a year ago. Pt asked when was Spenser's next available.

## 2023-06-01 ENCOUNTER — Ambulatory Visit (INDEPENDENT_AMBULATORY_CARE_PROVIDER_SITE_OTHER): Payer: No Typology Code available for payment source | Admitting: Family

## 2023-06-01 ENCOUNTER — Encounter (INDEPENDENT_AMBULATORY_CARE_PROVIDER_SITE_OTHER): Payer: Self-pay | Admitting: Family

## 2023-06-01 VITALS — BP 122/80 | HR 80 | Ht 69.49 in | Wt 200.6 lb

## 2023-06-01 DIAGNOSIS — E1065 Type 1 diabetes mellitus with hyperglycemia: Secondary | ICD-10-CM | POA: Diagnosis not present

## 2023-06-01 DIAGNOSIS — Z4681 Encounter for fitting and adjustment of insulin pump: Secondary | ICD-10-CM

## 2023-06-01 DIAGNOSIS — E109 Type 1 diabetes mellitus without complications: Secondary | ICD-10-CM

## 2023-06-01 LAB — POCT GLUCOSE (DEVICE FOR HOME USE): POC Glucose: 94 mg/dL (ref 70–99)

## 2023-06-01 LAB — POCT GLYCOSYLATED HEMOGLOBIN (HGB A1C): Hemoglobin A1C: 8.1 % — AB (ref 4.0–5.6)

## 2023-06-01 MED ORDER — DEXCOM G6 SENSOR MISC: 1 refills | Status: DC

## 2023-06-01 MED ORDER — OMNIPOD 5 DEXG7G6 PODS GEN 5 MISC: 1 refills | Status: DC

## 2023-06-01 MED ORDER — INSULIN ASPART 100 UNIT/ML IJ SOLN: INTRAMUSCULAR | 1 refills | Status: DC

## 2023-06-01 MED ORDER — DEXCOM G6 TRANSMITTER MISC: 1 refills | Status: DC

## 2023-06-01 NOTE — Progress Notes (Signed)
Pediatric Endocrinology Diabetes Consultation Follow-up Visit  Stephen Weber [birth year] [id number]  Chief Complaint: Follow-up type 1 diabetes   Carlean Jews, NP   HPI: Stephen Weber  is a 23 y.o. male presenting for follow-up of type 1 diabetes. he is accompanied to this visit by his father and sister.   1. "Stephen Weber" was seen by his PCP on November 13th 2015 for a sick visit due to family concerns regarding weight loss, increased thirst and frequent urination. He had lost ~15 pounds.  At the PCP's office Stephen Weber was noted to have a BG in the 400s and he was admitted to the pediatric unit at [facility] for further evaluation and management. CBG was 300. Venous pH was 7.342. Serum glucose was 338, CO2 20, and anion gap 18. Urine ketones were 15. HbA1c was 15.8%. Anti-GAD antibody was positive at 5.6 (normal < 1). Anti-insulin antibody was positive at 2.3 (normal < 0.4). Anti-islet cell antibody was negative. TSH was 1.460. Free T4 was 0.98. He was started on an MDI insulin plan with Lantus and Novolog.  He transitioned to an omnipod and dexcom CGM in 05/2015.  2. Since last visit to [facility] on 04/2022, Stephen Weber has been well.  No ER visits or hospitalizations.    He recently moved out and is living with a roommate. 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 He reports he is exercising 3 days per week and is eating healthy diet.   He feels like his diabetes care has been really good. Using Omnipod 5 and Dexcom G6 CGM, both are working well. He bolusing before eating "most of the time". Carb intake averages 60-70 grams of carbs per meal. Hypoglycemia does not occur frequently, none severe or requiring glucagon.     Insulin regimen: Using Humalog in his pod  12AM-8AM 1.0   8AM-2PM 1.35  2PM-12AM 1.20            Insulin to Carbohydrate Ratio 12AM-6AM 15  6AM-6pm 7  6pm-10pm 8  10pm-12pm 9       Insulin Sensitivity  Factor 12AM 40               Target Blood Glucose 12AM-6AM 110  6AM-9PM 120  9PM-12AM 110        Active insulin time 3 hours  Hypoglycemia:  Able to feel lows. He usually feels shaky.  No glucagon needed recently.   Insulin pump and CGM download:     Med-alert ID: not wearing  Pump/CGM sites: Using abdomen and legs  for pump and legs, and abs for CGM.  Annual labs due: Last exam on 02/2023. Due on 02/2023    3. ROS: Greater than 10 systems reviewed with pertinent positives listed in HPI, otherwise neg. Review of Systems  Constitutional:  Negative for malaise/fatigue.  HENT: Negative.    Eyes:  Negative for blurred vision and pain.  Respiratory:  Negative for cough and shortness of breath.   Cardiovascular:  Negative for chest pain and palpitations.  Gastrointestinal:  Negative for abdominal pain, constipation, diarrhea, nausea and vomiting.  Genitourinary:  Negative for frequency and urgency.  Musculoskeletal:  Negative for neck pain.  Skin:  Negative for itching and rash.  Neurological:  Negative for dizziness, tingling, tremors, sensory change, seizures, weakness and headaches.  Endo/Heme/Allergies:  Negative for polydipsia.  Psychiatric/Behavioral:  Negative for depression. The patient is not nervous/anxious.      Past Medical History:   Past  Medical History:  Diagnosis Date   Asthma    Diabetes mellitus without complication (HCC)     Medications:  Outpatient Encounter Medications as of 06/01/2023  Medication Sig   Continuous Glucose Sensor (DEXCOM G6 SENSOR) MISC CHANGE SENSOR EVERY 10 DAYS   Continuous Glucose Transmitter (DEXCOM G6 TRANSMITTER) MISC USE WITH DEXCOM G6 SENSOR  REUSE FOR 3 MONTHS   insulin aspart (NOVOLOG) 100 UNIT/ML injection INJECT UP TO 200 UNITS IN INSULIN PUMP EVERY 48-72 HOURS PER DKA AND HYPERGLYCEMIA PROTOCOLS (90 DAY SUPPLY)   insulin aspart (NOVOLOG) 100 UNIT/ML injection Inject 200 units into insulin pump every 2 days   Insulin  Disposable Pump (OMNIPOD 5 DEXG7G6 PODS GEN 5) MISC INJECT 1 DEVICE            SUBCUTANEOUSLY AS DIRECTED.CHANGE POD EVERY 2 DAYS.   ACCU-CHEK FASTCLIX LANCETS MISC 1 each by Does not apply route as directed. Check sugar 6 x daily (Patient not taking: Reported on 06/01/2023)   acetone, urine, test strip Check ketones per protocol (Patient not taking: Reported on 06/01/2023)   albuterol (PROVENTIL, VENTOLIN) (5 MG/ML) 0.5% NEBU Take by nebulization daily as needed. (Patient not taking: Reported on 06/01/2023)   Blood Glucose Monitoring Suppl (ACCU-CHEK GUIDE) w/Device KIT 1 kit by Does not apply route daily as needed. (Patient not taking: Reported on 06/01/2023)   glucose blood test strip Use to check blood sugars 6 times daily (Patient not taking: Reported on 06/01/2023)   insulin aspart (NOVOLOG) 100 UNIT/ML FlexPen Use up to 50 units daily per provider instructions; to use as back up in case insulin pump fails (Patient not taking: Reported on 06/01/2023)   insulin degludec (TRESIBA FLEXTOUCH) 100 UNIT/ML FlexTouch Pen Inject up to 50 units per day SubQ if pump failure (Patient not taking: Reported on 06/01/2023)   Insulin Pen Needle 31G X 5 MM MISC Use to administer insulin (Patient not taking: Reported on 06/01/2023)   montelukast (SINGULAIR) 10 MG tablet Take 10 mg by mouth at bedtime. (Patient not taking: Reported on 06/01/2023)   No facility-administered encounter medications on file as of 06/01/2023.    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 Growth %ile SmartLinks can only be used for patients less than 28 years old.  Ht Readings from Last 3 Encounters:  06/01/23 5' 9.49" (1.765 m)  09/12/21 5\' 9"  (1.753 m) (42%, Z= -0.19)*  04/05/21 5' 9.96" (1.777 m) (57%, Z= 0.17)*   * Growth percentiles are based on CDC (Boys, 2-20 Years) data.   Wt Readings from Last 3 Encounters:  06/01/23 200 lb 9.6 oz (91 kg)  05/01/22 191  lb 3.2 oz (86.7 kg) (88%, Z= 1.20)*  12/27/21 188 lb 12.8 oz (85.6 kg) (88%, Z= 1.17)*   * Growth percentiles are based on CDC (Boys, 2-20 Years) data.   Physical Exam   General: Well developed, well nourished male in no acute distress. Head: Normocephalic, atraumatic.   Eyes:  Pupils equal and round. EOMI.  Sclera white.  No eye drainage.   Ears/Nose/Mouth/Throat: Nares patent, no nasal drainage.  Normal dentition, mucous membranes moist.  Neck: supple, no cervical lymphadenopathy, no thyromegaly Cardiovascular: regular rate, normal S1/S2, no murmurs Respiratory: No increased work of breathing.  Lungs clear to auscultation bilaterally.  No wheezes. Abdomen: soft, nontender, nondistended. Normal bowel sounds.  No appreciable masses  Extremities: warm, well perfused, cap refill < 2 sec.   Musculoskeletal: Normal muscle mass.  Normal strength Skin: warm, dry.  No rash or lesions. Neurologic: alert and oriented, normal speech, no tremor   Labs:  Last a1c: 8.6 on 04/2022   Lab Results  Component Value Date   HGBA1C 8.1 (A) 06/01/2023   Results for orders placed or performed in visit on 06/01/23  POCT Glucose (Device for Home Use)   Collection Time: 06/01/23  1:24 PM  Result Value Ref Range   Glucose Fasting, POC     POC Glucose 94 70 - 99 mg/dl  POCT  glycosylated hemoglobin (Hb A1C)   Collection Time: 06/01/23  1:28 PM  Result Value Ref Range   Hemoglobin A1C 8.1 (A) 4.0 - 5.6 %   HbA1c POC (<> result, manual entry)     HbA1c, POC (prediabetic range)     HbA1c, POC (controlled diabetic range)      Assessment/Plan: Stephen Weber is a 23 y.o. male with uncontrolled type 1 diabetes on Omnipod 5  insulin pump. Pattern of hyperglycemia between 5pm-9pm. Hemoglobin A1c has improved to 8.1% since last visit bu tis higher then ADA goal of <7%. HIs time in target range is 53% (goal >70%).    1. DM w/o complication type I, uncontrolled (HCC)Hyperglycmia/ - Reviewed insulin pump and CGM download. Discussed trends and patterns.  - Rotate pump sites to prevent scar tissue.  - bolus 15 minutes prior to eating to limit blood sugar spikes.  - Reviewed carb counting and importance of accurate carb counting.  - Discussed signs and symptoms of hypoglycemia. Always have glucose available.  - POCT glucose and hemoglobin A1c  - Reviewed growth chart.  - Discussed release of Omnipod 5 Iphone app.  - Lab Orders         Lipid panel         COMPLETE METABOLIC PANEL WITH GFR         T4, free         TSH         Microalbumin / creatinine urine ratio         POCT Glucose (Device for Home Use)         POCT glycosylated hemoglobin (Hb A1C)      2. Insulin pump titration/Insulin pump in place    12AM-8AM 1.0 --> 1.0   8AM-2PM 1.35--> 1.45   2PM-12AM 1.20--> 1.35           31 units per day  Insulin to Carbohydrate Ratio 12AM-6AM 15  6AM-6pm 7  6pm-10pm 8--> 7   10pm-12pm 9--> 8        Insulin Sensitivity Factor 12AM 40               Target  Blood Glucose 12AM-6AM 110  6AM-9PM 120  9PM-12AM 110        Active insulin time 3 hourssiba  28 units  - Humalog: 1 unit per 8 grams of carbs   - ISF: 1 unit for ever 40 over 120  Follow-up:   3 month    LOS: 42 minutes  spent today reviewing the medical chart, counseling the patient/family, and  documenting today's visit. This time does not include CGM interpretation.    When a patient is on insulin, intensive monitoring of blood glucose levels is necessary to avoid hyperglycemia and hypoglycemia. Severe hyperglycemia/hypoglycemia can lead to hospital admissions and be life threatening.    Gretchen Short, DNP, FNP-C  Pediatric Specialist  [address]  [place], [address]  Tele: [phone]

## 2023-06-01 NOTE — Patient Instructions (Addendum)
  12AM-8AM 1.0 --> 1.0   8AM-2PM 1.35--> 1.45   2PM-12AM 1.20--> 1.35           31 units per day  Max basal rate 3 units per hour.   Insulin to Carbohydrate Ratio 12AM-6AM 15  6AM-6pm 7  6pm-10pm 8--> 7   10pm-12pm 9--> 8        Insulin Sensitivity Factor 12AM 40               Target Blood Glucose 12AM-6AM 110  6AM-9PM 120  9PM-12AM 110         Active insulin time 3 hour   Back up plan for pump failure  Tresiba  28 units  - Humalog: 1 unit per 8 grams of carbs   - ISF: 1 unit for ever 40 over 120

## 2023-06-02 ENCOUNTER — Encounter (INDEPENDENT_AMBULATORY_CARE_PROVIDER_SITE_OTHER): Payer: Self-pay

## 2023-06-02 LAB — COMPLETE METABOLIC PANEL WITH GFR
AG Ratio: 1.5 (calc) (ref 1.0–2.5)
ALT: 26 U/L (ref 9–46)
AST: 23 U/L (ref 10–40)
Albumin: 4.4 g/dL (ref 3.6–5.1)
Alkaline phosphatase (APISO): 48 U/L (ref 36–130)
BUN: 11 mg/dL (ref 7–25)
CO2: 27 mmol/L (ref 20–32)
Calcium: 9.2 mg/dL (ref 8.6–10.3)
Chloride: 102 mmol/L (ref 98–110)
Creat: 1.15 mg/dL (ref 0.60–1.24)
Globulin: 2.9 g/dL (ref 1.9–3.7)
Glucose, Bld: 102 mg/dL (ref 65–139)
Potassium: 4.4 mmol/L (ref 3.5–5.3)
Sodium: 137 mmol/L (ref 135–146)
Total Bilirubin: 0.6 mg/dL (ref 0.2–1.2)
Total Protein: 7.3 g/dL (ref 6.1–8.1)
eGFR: 93 mL/min/{1.73_m2} (ref >=60)

## 2023-06-02 LAB — LIPID PANEL
Cholesterol: 156 mg/dL (ref <200)
HDL: 57 mg/dL (ref >=40)
LDL Cholesterol (Calc): 89 mg/dL
Non-HDL Cholesterol (Calc): 99 mg/dL (ref <130)
Total CHOL/HDL Ratio: 2.7 (calc) (ref <5.0)
Triglycerides: 35 mg/dL (ref <150)

## 2023-06-02 LAB — T4, FREE: Free T4: 1 ng/dL (ref 0.8–1.4)

## 2023-06-02 LAB — TSH: TSH: 1.45 m[IU]/L (ref 0.40–4.50)

## 2023-08-04 ENCOUNTER — Encounter (INDEPENDENT_AMBULATORY_CARE_PROVIDER_SITE_OTHER): Payer: Self-pay

## 2023-08-06 ENCOUNTER — Ambulatory Visit (INDEPENDENT_AMBULATORY_CARE_PROVIDER_SITE_OTHER): Payer: No Typology Code available for payment source | Admitting: Family

## 2023-08-17 ENCOUNTER — Encounter (INDEPENDENT_AMBULATORY_CARE_PROVIDER_SITE_OTHER): Payer: Self-pay

## 2023-09-22 ENCOUNTER — Ambulatory Visit (INDEPENDENT_AMBULATORY_CARE_PROVIDER_SITE_OTHER): Admitting: Family

## 2023-09-22 NOTE — Progress Notes (Deleted)
 Pediatric Endocrinology Diabetes Consultation Follow-up Visit  Torrey Ballinas August 17, 2000 [id number]  Chief Complaint: Follow-up type 1 diabetes   Sharyon Deis, NP   HPI: Coal  is a 23 y.o. male presenting for follow-up of type 1 diabetes. he is accompanied to this visit by his father and sister.   1. "Norberta Beans" was seen by his PCP on November 13th 2015 for a sick visit due to family concerns regarding weight loss, increased thirst and frequent urination. He had lost ~15 pounds.  At the PCP's office Norberta Beans was noted to have a BG in the 400s and he was admitted to the pediatric unit at [facility] for further evaluation and management. CBG was 300. Venous pH was 7.342. Serum glucose was 338, CO2 20, and anion gap 18. Urine ketones were 15. HbA1c was 15.8%. Anti-GAD antibody was positive at 5.6 (normal < 1). Anti-insulin  antibody was positive at 2.3 (normal < 0.4). Anti-islet cell antibody was negative. TSH was 1.460. Free T4 was 0.98. He was started on an MDI insulin  plan with Lantus  and Novolog .  He transitioned to an omnipod and dexcom CGM in 05/2015.  2. Since last visit to [facility] on 05/2023, Norberta Beans has been well.  No ER visits or hospitalizations.    He recently moved out and is living with a roommate. 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 He reports he is exercising 3 days per week and is eating healthy diet.   He feels like his diabetes care has been really good. Using Omnipod 5 and Dexcom G6 CGM, both are working well. He bolusing before eating "most of the time". Carb intake averages 60-70 grams of carbs per meal. Hypoglycemia does not occur frequently, none severe or requiring glucagon .     Insulin  regimen: Using Humalog  in his pod  12AM-8AM 1.0   8AM-2PM 1.45   2PM-12AM  1.35           31 units per day  Insulin  to Carbohydrate Ratio 12AM-6AM 15  6AM-6pm 7  6pm-10pm 7   10pm-12pm 8        Insulin   Sensitivity Factor 12AM 40               Target Blood Glucose 12AM-6AM 110  6AM-9PM 120  9PM-12AM 110          Hypoglycemia:  Able to feel lows. He usually feels shaky.  No glucagon  needed recently.   Insulin  pump and CGM download:     Med-alert ID: not wearing  Pump/CGM sites: Using abdomen and legs  for pump and legs, and abs for CGM.  Annual labs due: Last exam on 02/2023. Due on 02/2023    3. ROS: Greater than 10 systems reviewed with pertinent positives listed in HPI, otherwise neg. Review of Systems  Constitutional:  Negative for malaise/fatigue.  HENT: Negative.    Eyes:  Negative for blurred vision and pain.  Respiratory:  Negative for cough and shortness of breath.   Cardiovascular:  Negative for chest pain and palpitations.  Gastrointestinal:  Negative for abdominal pain, constipation, diarrhea, nausea and vomiting.  Genitourinary:  Negative for frequency and urgency.  Musculoskeletal:  Negative for neck pain.  Skin:  Negative for itching and rash.  Neurological:  Negative for dizziness, tingling, tremors, sensory change, seizures, weakness and headaches.  Endo/Heme/Allergies:  Negative for polydipsia.  Psychiatric/Behavioral:  Negative for depression. The patient is not nervous/anxious.      Past Medical  History:   Past Medical History:  Diagnosis Date  . Asthma   . Diabetes mellitus without complication (HCC)     Medications:  Outpatient Encounter Medications as of 09/22/2023  Medication Sig  . ACCU-CHEK FASTCLIX LANCETS MISC 1 each by Does not apply route as directed. Check sugar 6 x daily (Patient not taking: Reported on 06/01/2023)  . acetone, urine, test strip Check ketones per protocol (Patient not taking: Reported on 06/01/2023)  . albuterol  (PROVENTIL , VENTOLIN ) (5 MG/ML) 0.5% NEBU Take by nebulization daily as needed. (Patient not taking: Reported on 06/01/2023)  . Blood Glucose Monitoring Suppl (ACCU-CHEK GUIDE) w/Device KIT 1 kit by Does not apply  route daily as needed. (Patient not taking: Reported on 06/01/2023)  . Continuous Glucose Sensor (DEXCOM G6 SENSOR) MISC Change sensor every 10 days  . Continuous Glucose Transmitter (DEXCOM G6 TRANSMITTER) MISC USE WITH DEXCOM G6 SENSOR  REUSE FOR 3 MONTHS  . glucose blood test strip Use to check blood sugars 6 times daily (Patient not taking: Reported on 06/01/2023)  . insulin  aspart (NOVOLOG ) 100 UNIT/ML FlexPen Use up to 50 units daily per provider instructions; to use as back up in case insulin  pump fails (Patient not taking: Reported on 06/01/2023)  . insulin  aspart (NOVOLOG ) 100 UNIT/ML injection Inject 200 units into insulin  pump every 2 days  . insulin  aspart (NOVOLOG ) 100 UNIT/ML injection INJECT UP TO 200 UNITS IN INSULIN  PUMP EVERY 48-72 HOURS PER DKA AND HYPERGLYCEMIA PROTOCOLS (90 DAY SUPPLY)  . insulin  degludec (TRESIBA  FLEXTOUCH) 100 UNIT/ML FlexTouch Pen Inject up to 50 units per day SubQ if pump failure (Patient not taking: Reported on 06/01/2023)  . Insulin  Disposable Pump (OMNIPOD 5 DEXG7G6 PODS GEN 5) MISC INJECT 1 DEVICE SUBCUTANEOUSLY AS DIRECTED.CHANGE POD EVERY 2 DAYS.  . Insulin  Pen Needle 31G X 5 MM MISC Use to administer insulin  (Patient not taking: Reported on 06/01/2023)  . montelukast  (SINGULAIR ) 10 MG tablet Take 10 mg by mouth at bedtime. (Patient not taking: Reported on 06/01/2023)   No facility-administered encounter medications on file as of 09/22/2023.    Allergies: Allergies  Allergen Reactions  . Pollen Extract     Reports skin lumps when using novolog   Surgical History: No recent surgeries  Family History:  Family History  Problem Relation Age of Onset  . Diabetes Maternal Grandmother   . Kidney disease Maternal Grandfather   . Hypertension Maternal Grandfather   . Hypertension Paternal Grandmother   . Healthy Mother   . 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   There were no vitals taken for this visit. Body mass index: body mass index is unknown because there is no height or weight on file. Growth %ile SmartLinks can only be used for patients less than 59 years old.  Ht Readings from Last 3 Encounters:  06/01/23 5' 9.49" (1.765 m)  09/12/21 5\' 9"  (1.753 m) (42%, Z= -0.19)*  04/05/21 5' 9.96" (1.777 m) (57%, Z= 0.17)*   * Growth percentiles are based on CDC (Boys, 2-20 Years) data.   Wt Readings from Last 3 Encounters:  06/01/23 200 lb 9.6 oz (91 kg)  05/01/22 191 lb 3.2 oz (86.7 kg) (88%, Z= 1.20)*  12/27/21 188 lb 12.8 oz (85.6 kg) (88%, Z= 1.17)*   * Growth percentiles are based on CDC (Boys, 2-20 Years) data.   Physical Exam  General: Well developed, well nourished male in no acute distress.   Head: Normocephalic, atraumatic.   Eyes:  Pupils equal and round. EOMI.  Sclera white.  No eye drainage.   Ears/Nose/Mouth/Throat: Nares patent, no nasal drainage.  Normal dentition, mucous membranes moist.  Neck: supple, no cervical lymphadenopathy, no thyromegaly Cardiovascular: regular rate, normal S1/S2, no murmurs Respiratory: No increased work of breathing.  Lungs clear to auscultation bilaterally.  No wheezes. Abdomen: soft, nontender, nondistended. Normal bowel sounds.  No appreciable masses  Extremities: warm, well perfused, cap refill < 2 sec.   Musculoskeletal: Normal muscle mass.  Normal strength Skin: warm, dry.  No rash or lesions. Neurologic: alert and oriented, normal speech, no tremor    Labs:  Last a1c: 8.6 on 04/2022   Lab Results  Component Value Date   HGBA1C 8.1 (A) 06/01/2023   Results for orders placed or performed in visit on 06/01/23  POCT Glucose (Device for Home Use)   Collection Time: 06/01/23  1:24 PM  Result Value Ref Range   Glucose Fasting, POC     POC Glucose 94 70 - 99 mg/dl  POCT glycosylated hemoglobin (Hb A1C)   Collection Time: 06/01/23  1:28 PM  Result  Value Ref Range   Hemoglobin A1C 8.1 (A) 4.0 - 5.6 %   HbA1c POC (<> result, manual entry)     HbA1c, POC (prediabetic range)     HbA1c, POC (controlled diabetic range)    Lipid panel   Collection Time: 06/01/23  1:50 PM  Result Value Ref Range   Cholesterol 156 <200 mg/dL   HDL 57 > OR = 40 mg/dL   Triglycerides 35 <425 mg/dL   LDL Cholesterol (Calc) 89 mg/dL (calc)   Total CHOL/HDL Ratio 2.7 <5.0 (calc)   Non-HDL Cholesterol (Calc) 99 <956 mg/dL (calc)  COMPLETE METABOLIC PANEL WITH GFR   Collection Time: 06/01/23  1:50 PM  Result Value Ref Range   Glucose, Bld 102 65 - 139 mg/dL   BUN 11 7 - 25 mg/dL   Creat 3.87 5.64 - 3.32 mg/dL   eGFR 93 > OR = 60 RJ/JOA/4.16S0   BUN/Creatinine Ratio SEE NOTE: 6 - 22 (calc)   Sodium 137 135 - 146 mmol/L   Potassium 4.4 3.5 - 5.3 mmol/L   Chloride 102 98 - 110 mmol/L   CO2 27 20 - 32 mmol/L   Calcium 9.2 8.6 - 10.3 mg/dL   Total Protein 7.3 6.1 - 8.1 g/dL   Albumin 4.4 3.6 - 5.1 g/dL   Globulin 2.9 1.9 - 3.7 g/dL (calc)   AG Ratio 1.5 1.0 - 2.5 (calc)   Total Bilirubin 0.6 0.2 - 1.2 mg/dL   Alkaline phosphatase (APISO) 48 36 - 130 U/L   AST 23 10 - 40 U/L   ALT 26 9 - 46 U/L  T4, free   Collection Time: 06/01/23  1:50 PM  Result Value Ref Range   Free T4 1.0 0.8 - 1.4 ng/dL  TSH   Collection Time: 06/01/23  1:50 PM  Result Value Ref Range   TSH 1.45 0.40 - 4.50 mIU/L    Assessment/Plan: Jaivion is a 23 y.o. male with uncontrolled type 1 diabetes on Omnipod 5  insulin  pump. Pattern of hyperglycemia between 5pm-9pm. Hemoglobin A1c has improved to 8.1% since last visit bu tis higher then ADA goal of <7%. HIs time in target range is 53% (goal >70%).    1. DM w/o complication type I, uncontrolled (HCC)Hyperglycmia/ - Reviewed insulin  pump  and CGM download. Discussed trends and patterns.  - Rotate pump sites to prevent scar tissue.  - bolus 15 minutes prior to eating to limit blood sugar spikes.  - Reviewed carb counting and  importance of accurate carb counting.  - Discussed signs and symptoms of hypoglycemia. Always have glucose available.  - POCT glucose and hemoglobin A1c  - Reviewed growth chart.   -  Lab Orders  No laboratory test(s) ordered today     2. Insulin  pump titration/Insulin  pump in place    12AM-8AM 1.0 --> 1.0   8AM-2PM 1.35--> 1.45   2PM-12AM 1.20--> 1.35           31 units per day  Insulin  to Carbohydrate Ratio 12AM-6AM 15  6AM-6pm 7  6pm-10pm 8--> 7   10pm-12pm 9--> 8        Insulin  Sensitivity Factor 12AM 40               Target Blood Glucose 12AM-6AM 110  6AM-9PM 120  9PM-12AM 110        Active insulin  time 3 hourssiba  28 units  - Humalog : 1 unit per 8 grams of carbs   - ISF: 1 unit for ever 40 over 120  Follow-up:   3 month    LOS: 42 minutes  spent today reviewing the medical chart, counseling the patient/family, and documenting today's visit. This time does not include CGM interpretation.    When a patient is on insulin , intensive monitoring of blood glucose levels is necessary to avoid hyperglycemia and hypoglycemia. Severe hyperglycemia/hypoglycemia can lead to hospital admissions and be life threatening.    Candee Cha, DNP, FNP-C  Pediatric Specialist  [address]  [place], [address]  Tele: [phone]

## 2024-01-21 ENCOUNTER — Telehealth (INDEPENDENT_AMBULATORY_CARE_PROVIDER_SITE_OTHER): Payer: Self-pay | Admitting: Family

## 2024-01-21 NOTE — Telephone Encounter (Signed)
 Who's calling (name and relationship to patient) : Dianne; [facility].   Best contact number: [phone] opt [phone]   Provider they see: Verdon, NP   Reason for call: Lawrnce was calling regarding a rX refill for Omnipod  G7, G6    Call ID:      PRESCRIPTION REFILL ONLY  Name of prescription:  Pharmacy:

## 2024-01-22 NOTE — Telephone Encounter (Signed)
 Called and talked with Stephen Weber, I let her know pt needs to see pcp or urgent care for refills due to provider no longer being here and he has aged out of pediatrics. Stephen Weber states she will talk with the pt.

## 2024-02-08 ENCOUNTER — Ambulatory Visit

## 2024-02-12 ENCOUNTER — Ambulatory Visit (INDEPENDENT_AMBULATORY_CARE_PROVIDER_SITE_OTHER)

## 2024-02-12 VITALS — BP 130/81 | HR 68 | Temp 98.1°F | Ht 69.49 in | Wt 188.1 lb

## 2024-02-12 DIAGNOSIS — G252 Other specified forms of tremor: Secondary | ICD-10-CM

## 2024-02-12 DIAGNOSIS — E109 Type 1 diabetes mellitus without complications: Secondary | ICD-10-CM | POA: Diagnosis not present

## 2024-02-12 DIAGNOSIS — E1065 Type 1 diabetes mellitus with hyperglycemia: Secondary | ICD-10-CM | POA: Diagnosis not present

## 2024-02-12 DIAGNOSIS — Z130 Encounter for screening for diseases of the blood and blood-forming organs and certain disorders involving the immune mechanism: Secondary | ICD-10-CM

## 2024-02-12 DIAGNOSIS — Z1159 Encounter for screening for other viral diseases: Secondary | ICD-10-CM

## 2024-02-12 DIAGNOSIS — R251 Tremor, unspecified: Secondary | ICD-10-CM | POA: Insufficient documentation

## 2024-02-12 LAB — POCT GLYCOSYLATED HEMOGLOBIN (HGB A1C): Hemoglobin A1C: 8 % — AB (ref 4.0–5.6)

## 2024-02-12 LAB — POCT UA - MICROALBUMIN
Albumin/Creatinine Ratio, Urine, POC: <30
Creatinine, POC: 300 mg/dL
Microalbumin Ur, POC: 30 mg/L

## 2024-02-12 MED ORDER — INSULIN ASPART 100 UNIT/ML IJ SOLN: INTRAMUSCULAR | 1 refills | Status: DC

## 2024-02-12 MED ORDER — OMNIPOD 5 DEXG7G6 PODS GEN 5 MISC: 1 refills | Status: DC

## 2024-02-12 MED ORDER — DEXCOM G6 TRANSMITTER MISC: 1 refills | Status: DC

## 2024-02-12 MED ORDER — DEXCOM G6 SENSOR MISC: 1 refills | Status: DC

## 2024-02-12 NOTE — Assessment & Plan Note (Signed)
 Type 1 diabetes well-controlled with insulin . Hemoglobin A1c stable at 8.0%. - Refill insulin  prescriptions, including Novolog  and pump supplies. - Refill Dexcom G6 supplies, including transmitter. - Refer to adult endocrinologist at [facility]. - Advised to contact endocrinology if not contacted within a week.

## 2024-02-12 NOTE — Progress Notes (Signed)
 New Patient Office Visit  Subjective    Patient ID: Stephen Weber, male    DOB: 07-12-2000  Age: 23 y.o. MRN: [id number]  CC:  Chief Complaint  Patient presents with   New Patient (Initial Visit)    Establish Care   History of Present Illness   Stephen Weber is a 23 year old male with diabetes who presents for medication refills and endocrinology referral.  Diabetes mellitus, Type 1  management - Uses insulin  and a Dexcom G6 for blood glucose monitoring - Current insulin  regimen: 200 units of Novolog  that he preloads into his insulin  pump - A1c checked today, lower than in February - Recent urine tests normal - Fasting blood work earlier this year showed normal lipid panel, thyroid , kidney, and liver function - Presents for medication refills and endocrinology referral - Was previously being seen by a pediatric endocrinologist but requires adult endo now that he is 23   Tremor and neuromuscular symptoms - Slight hand tremor, noticeable when holding objects such as a phone or pen in an unusual manner. Intermittent.  - Onset of tremor associated with him stopping lifting weights at the gym     Screenings:  Colon Cancer: N/A Lung Cancer: N/A Breast Cancer: N/A Diabetes: Type 1, A1c today 8.0. Ophthalmology: UTD, Foot: UTD  HLD: Checking lipid panel with labs The ASCVD Risk score (Arnett DK, et al., 2019) failed to calculate for the following reasons:   The 2019 ASCVD risk score is only valid for ages 77 to 56  Outpatient Encounter Medications as of 02/12/2024  Medication Sig   ACCU-CHEK FASTCLIX LANCETS MISC 1 each by Does not apply route as directed. Check sugar 6 x daily   acetone, urine, test strip Check ketones per protocol   albuterol  (PROVENTIL , VENTOLIN ) (5 MG/ML) 0.5% NEBU Take by nebulization daily as needed.   Blood Glucose Monitoring Suppl (ACCU-CHEK GUIDE) w/Device KIT 1 kit by Does not apply route daily as needed.   Continuous Glucose Sensor (DEXCOM  G6 SENSOR) MISC Change sensor every 10 days   Continuous Glucose Transmitter (DEXCOM G6 TRANSMITTER) MISC USE WITH DEXCOM G6 SENSOR  REUSE FOR 3 MONTHS   glucose blood test strip Use to check blood sugars 6 times daily   insulin  aspart (NOVOLOG ) 100 UNIT/ML FlexPen Use up to 50 units daily per provider instructions; to use as back up in case insulin  pump fails   insulin  aspart (NOVOLOG ) 100 UNIT/ML injection Inject 200 units into insulin  pump every 2 days   insulin  aspart (NOVOLOG ) 100 UNIT/ML injection INJECT UP TO 200 UNITS IN INSULIN  PUMP EVERY 48-72 HOURS PER DKA AND HYPERGLYCEMIA PROTOCOLS (90 DAY SUPPLY)   Insulin  Disposable Pump (OMNIPOD 5 DEXG7G6 PODS GEN 5) MISC INJECT 1 DEVICE SUBCUTANEOUSLY AS DIRECTED.CHANGE POD EVERY 2 DAYS.   Insulin  Pen Needle 31G X 5 MM MISC Use to administer insulin    montelukast  (SINGULAIR ) 10 MG tablet Take 10 mg by mouth at bedtime.   [DISCONTINUED] Continuous Glucose Sensor (DEXCOM G6 SENSOR) MISC Change sensor every 10 days   [DISCONTINUED] Continuous Glucose Transmitter (DEXCOM G6 TRANSMITTER) MISC USE WITH DEXCOM G6 SENSOR  REUSE FOR 3 MONTHS   [DISCONTINUED] insulin  aspart (NOVOLOG ) 100 UNIT/ML injection INJECT UP TO 200 UNITS IN INSULIN  PUMP EVERY 48-72 HOURS PER DKA AND HYPERGLYCEMIA PROTOCOLS (90 DAY SUPPLY)   [DISCONTINUED] insulin  degludec (TRESIBA  FLEXTOUCH) 100 UNIT/ML FlexTouch Pen Inject up to 50 units per day SubQ if pump failure (Patient not taking: Reported on 06/01/2023)   [  DISCONTINUED] Insulin  Disposable Pump (OMNIPOD 5 DEXG7G6 PODS GEN 5) MISC INJECT 1 DEVICE SUBCUTANEOUSLY AS DIRECTED.CHANGE POD EVERY 2 DAYS.   No facility-administered encounter medications on file as of 02/12/2024.    Past Medical History:  Diagnosis Date   Asthma    Diabetes mellitus without complication (HCC)     History reviewed. No pertinent surgical history.  Family History  Problem Relation Age of Onset   Diabetes Maternal Grandmother    Kidney disease  Maternal Grandfather    Hypertension Maternal Grandfather    Hypertension Paternal Grandmother    Healthy Mother    Healthy Father     Social History   Socioeconomic History   Marital status: Single    Spouse name: Not on file   Number of children: Not on file   Years of education: Not on file   Highest education level: Associate degree: occupational, Scientist, product/process development, or vocational program  Occupational History   Not on file  Tobacco Use   Smoking status: Never   Smokeless tobacco: Never  Substance and Sexual Activity   Alcohol use: Never   Drug use: Never   Sexual activity: Never  Other Topics Concern   Not on file  Social History Narrative   Lives at home with mom, dad and sister. Will be a freshman at [employer] in the fall.    Social Drivers of Corporate investment banker Strain: Low Risk  (02/12/2024)   Overall Financial Resource Strain (CARDIA)    Difficulty of Paying Living Expenses: Not very hard  Food Insecurity: No Food Insecurity (02/12/2024)   Hunger Vital Sign    Worried About Running Out of Food in the Last Year: Never true    Ran Out of Food in the Last Year: Never true  Transportation Needs: No Transportation Needs (02/12/2024)   PRAPARE - Administrator, Civil Service (Medical): No    Lack of Transportation (Non-Medical): No  Physical Activity: Sufficiently Active (02/12/2024)   Exercise Vital Sign    Days of Exercise per Week: 5 days    Minutes of Exercise per Session: 60 min  Stress: No Stress Concern Present (02/12/2024)   [employer] of Occupational Health - Occupational Stress Questionnaire    Feeling of Stress: Only a little  Social Connections: Moderately Isolated (02/12/2024)   Social Connection and Isolation Panel    Frequency of Communication with Friends and Family: More than three times a week    Frequency of Social Gatherings with Friends and Family: More than three times a week    Attends Religious Services: More than 4 times per  year    Active Member of [employer] or Organizations: No    Attends Engineer, structural: Not on file    Marital Status: Never married  Intimate Partner Violence: Not on file    ROS  Per HPI      Objective    BP 130/81   Pulse 68   Temp 98.1 F (36.7 C) (Oral)   Ht 5' 9.49 (1.765 m)   Wt 188 lb 1.3 oz (85.3 kg)   SpO2 98%   BMI 27.38 kg/m   Physical Exam Constitutional:      General: He is not in acute distress.    Appearance: Normal appearance.  Cardiovascular:     Rate and Rhythm: Normal rate and regular rhythm.     Heart sounds: Normal heart sounds. No murmur heard.    No friction rub. No gallop.  Pulmonary:  Effort: Pulmonary effort is normal. No respiratory distress.     Breath sounds: Normal breath sounds.  Musculoskeletal:        General: No swelling.     Cervical back: Neck supple.  Lymphadenopathy:     Cervical: No cervical adenopathy.  Skin:    General: Skin is warm and dry.  Neurological:     General: No focal deficit present.     Mental Status: He is alert.     Cranial Nerves: Cranial nerves 2-12 are intact.  Psychiatric:        Mood and Affect: Mood normal.        Behavior: Behavior normal.        Thought Content: Thought content normal.          Assessment & Plan:   Type 1 diabetes mellitus with hyperglycemia (HCC) Assessment & Plan: Type 1 diabetes well-controlled with insulin . Hemoglobin A1c stable at 8.0%. - Refill insulin  prescriptions, including Novolog  and pump supplies. - Refill Dexcom G6 supplies, including transmitter. - Refer to adult endocr[facility] at [facility]. - Advised to contact endocrinology if not contacted within a week.  Orders: -     Dexcom G6 Sensor; Change sensor every 10 days  Dispense: 9 each; Refill: 1 -     Dexcom G6 Transmitter; USE WITH DEXCOM G6 SENSOR  REUSE FOR 3 MONTHS  Dispense: 1 each; Refill: 1 -     Insulin  Aspart; INJECT UP TO 200 UNITS IN INSULIN  PUMP EVERY 48-72 HOURS PER DKA AND  HYPERGLYCEMIA PROTOCOLS (90 DAY SUPPLY)  Dispense: 120 mL; Refill: 1 -     Omnipod 5 DexG7G6 Pods Gen 5; INJECT 1 DEVICE SUBCUTANEOUSLY AS DIRECTED.CHANGE POD EVERY 2 DAYS.  Dispense: 15 each; Refill: 1 -     Ambulatory referral to Endocrinology -     Hemoglobin A1c; Future -     Comprehensive metabolic panel with GFR; Future  Type 1 diabetes mellitus without complications (HCC) Assessment & Plan: Type 1 diabetes well-controlled with insulin . Hemoglobin A1c stable at 8.0%. - Refill insulin  prescriptions, including Novolog  and pump supplies. - Refill Dexcom G6 supplies, including transmitter. - Refer to adult endocrinologist at [facility]. - Advised to contact endocrinology if not contacted within a week.  Orders: -     POCT glycosylated hemoglobin (Hb A1C) -     POCT UA - Microalbumin  Screening for endocrine, nutritional, metabolic and immunity disorder -     VITAMIN D 25 Hydroxy (Vit-D Deficiency, Fractures); Future -     CBC with Differential/Platelet; Future -     Comprehensive metabolic panel with GFR; Future -     Lipid panel; Future -     TSH; Future  Screening for viral disease -     HIV Antibody (routine testing w rflx); Future -     Hepatitis C antibody; Future  Fine tremor -     Magnesium; Future -     B12 and Folate Panel; Future  Tremor of right hand Assessment & Plan: ntermittent tremor and muscle fasciculations likely benign, possibly related to gym activity. Likely not linked to hypoglycemia give one-sided. - Order blood work for magnesium, B12, folate, and electrolytes.      Return in about 3 months (around 05/14/2024) for DM.   Saddie JULIANNA Sacks, PA-C

## 2024-02-12 NOTE — Patient Instructions (Signed)
 VISIT SUMMARY: Today, you came in for medication refills and a referral to an endocrinologist. We discussed your diabetes management and noted that your blood sugar control has improved. We also addressed your concerns about a slight hand tremor and muscle tightness.  YOUR PLAN: TYPE 1 DIABETES MELLITUS WITHOUT COMPLICATIONS: Your diabetes is well-controlled with insulin , and your recent A1c levels have improved. -We have refilled your insulin  prescriptions, including Novolog  and pump supplies. -We have also refilled your Dexcom G6 supplies, including the transmitter. -You will be referred to an adult endocrinologist at [facility]. Please contact them if you do not hear from them within a week.  TREMOR AND MUSCLE FASCICULATIONS: You have a slight hand tremor and muscle tightness, likely related to your gym activity. These symptoms are likely not linked to low blood sugar or electrolyte imbalance. -We will order blood work to check your magnesium, B12, folate, and electrolyte levels.  If you have any problems before your next visit feel free to message me via MyChart (minor issues or questions) or call the office, otherwise you may reach out to schedule an office visit.  Thank you! Saddie Sacks, PA-C

## 2024-02-12 NOTE — Assessment & Plan Note (Signed)
 ntermittent tremor and muscle fasciculations likely benign, possibly related to gym activity. Likely not linked to hypoglycemia give one-sided. - Order blood work for magnesium, B12, folate, and electrolytes.

## 2024-03-01 DIAGNOSIS — E1065 Type 1 diabetes mellitus with hyperglycemia: Secondary | ICD-10-CM

## 2024-03-02 MED ORDER — OMNIPOD 5 DEXG7G6 PODS GEN 5 MISC: 1 refills | Status: DC

## 2024-03-02 MED ORDER — DEXCOM G6 TRANSMITTER MISC: 1 refills | Status: DC

## 2024-03-02 MED ORDER — DEXCOM G6 SENSOR MISC: 1 refills | Status: DC

## 2024-03-02 MED ORDER — INSULIN ASPART 100 UNIT/ML IJ SOLN: INTRAMUSCULAR | 1 refills | Status: DC

## 2024-05-10 ENCOUNTER — Other Ambulatory Visit

## 2024-05-10 DIAGNOSIS — Z1159 Encounter for screening for other viral diseases: Secondary | ICD-10-CM

## 2024-05-10 DIAGNOSIS — Z130 Encounter for screening for diseases of the blood and blood-forming organs and certain disorders involving the immune mechanism: Secondary | ICD-10-CM

## 2024-05-10 DIAGNOSIS — G252 Other specified forms of tremor: Secondary | ICD-10-CM

## 2024-05-10 DIAGNOSIS — E1065 Type 1 diabetes mellitus with hyperglycemia: Secondary | ICD-10-CM

## 2024-05-17 ENCOUNTER — Ambulatory Visit

## 2024-05-18 ENCOUNTER — Other Ambulatory Visit: Payer: Self-pay

## 2024-05-18 DIAGNOSIS — E109 Type 1 diabetes mellitus without complications: Secondary | ICD-10-CM

## 2024-05-18 DIAGNOSIS — E1065 Type 1 diabetes mellitus with hyperglycemia: Secondary | ICD-10-CM

## 2024-05-18 MED ORDER — OMNIPOD 5 DEXG7G6 PODS GEN 5 MISC: 1 refills | Status: AC

## 2024-05-18 MED ORDER — DEXCOM G6 SENSOR MISC: 1 refills | Status: AC

## 2024-05-18 MED ORDER — DEXCOM G6 TRANSMITTER MISC: 1 refills | Status: AC

## 2024-05-18 MED ORDER — NOVOLOG 100 UNIT/ML IJ SOLN: 200.0000 [IU] | INTRAMUSCULAR | 0 refills | Status: AC

## 2024-05-18 MED ORDER — INSULIN ASPART 100 UNIT/ML FLEXPEN: PEN_INJECTOR | SUBCUTANEOUS | 6 refills | Status: AC

## 2024-05-18 MED ORDER — NOVOLOG 100 UNIT/ML IJ SOLN: 200.0000 [IU] | INTRAMUSCULAR | 1 refills | Status: AC

## 2024-05-20 ENCOUNTER — Telehealth: Payer: Self-pay

## 2024-05-20 ENCOUNTER — Other Ambulatory Visit (HOSPITAL_COMMUNITY): Payer: Self-pay

## 2024-05-20 NOTE — Telephone Encounter (Signed)
 Pharmacy Patient Advocate Encounter   Received notification from [facility] KEY that prior authorization for Dexcom G6 Sensor  is required/requested.   Insurance verification completed.   The patient is insured through [facility].   Per test claim: PA required; PA submitted to above mentioned insurance via Latent Key/confirmation #/EOC [id number] Status is pending

## 2024-05-20 NOTE — Telephone Encounter (Signed)
 Pharmacy Patient Advocate Encounter   Received notification from [facility] KEY that prior authorization for Dexcom G6 Transmitter  is required/requested.   Insurance verification completed.   The patient is insured through [facility].   Per test claim: PA required; PA submitted to above mentioned insurance via Latent Key/confirmation #/EOC [id number] Status is pending

## 2024-05-26 ENCOUNTER — Other Ambulatory Visit (HOSPITAL_COMMUNITY): Payer: Self-pay

## 2024-05-26 NOTE — Telephone Encounter (Signed)
 Contacted pt and informed him of the approval of his dexcom transmitter and sensor and he stated that he was on his last pod and possibly needing something to get him his insulin  and I informed him that it looked like everything was ordered and he stated that it showed it would be delivered tomorrow and I told him to call to see when and what would be delivered to be sure he has everything that he needs. Routing to PCP as an FYI

## 2024-05-26 NOTE — Telephone Encounter (Signed)
 Pharmacy Patient Advocate Encounter  Received notification from [facility] that Prior Authorization for Dexcom G6 Transmitter   has been APPROVED from 05/18/2024 to 05/23/2025. Unable to obtain price due to refill too soon rejection, last fill date 05/18/2024 next available fill date02/13/2026   PA #/Case ID/Reference #: [id number]

## 2024-05-26 NOTE — Telephone Encounter (Signed)
 Pharmacy Patient Advocate Encounter  Received notification from [facility] that Prior Authorization for Dexcom G6 Sensor  has been APPROVED from 05/18/2024 to 05/24/2025. Unable to obtain price due to refill too soon rejection, last fill date 01/21/206 next available fill date02/15/2026
